# Patient Record
Sex: Male | Born: 1960 | Race: Black or African American | Hispanic: No | Marital: Single | Smoking: Former smoker
Health system: Southern US, Community
[De-identification: ages and names within clinical notes are randomized; demographics above are authoritative.]

## PROBLEM LIST (undated history)

## (undated) DIAGNOSIS — K635 Polyp of colon: Secondary | ICD-10-CM

## (undated) DIAGNOSIS — I10 Essential (primary) hypertension: Secondary | ICD-10-CM

## (undated) DIAGNOSIS — E7800 Pure hypercholesterolemia, unspecified: Secondary | ICD-10-CM

## (undated) HISTORY — PX: FRACTURE SURGERY: SHX138

---

## 2016-08-09 ENCOUNTER — Encounter (HOSPITAL_BASED_OUTPATIENT_CLINIC_OR_DEPARTMENT_OTHER): Payer: Self-pay | Admitting: Emergency Medicine

## 2016-08-09 ENCOUNTER — Emergency Department (HOSPITAL_BASED_OUTPATIENT_CLINIC_OR_DEPARTMENT_OTHER)
Admission: EM | Admit: 2016-08-09 | Discharge: 2016-08-09 | Disposition: A | Discharge status: Home / Self Care | Payer: Managed Care, Other (non HMO) | Attending: Emergency Medicine | Admitting: Emergency Medicine

## 2016-08-09 DIAGNOSIS — F17210 Nicotine dependence, cigarettes, uncomplicated: Secondary | ICD-10-CM | POA: Insufficient documentation

## 2016-08-09 DIAGNOSIS — Z79899 Other long term (current) drug therapy: Secondary | ICD-10-CM | POA: Diagnosis not present

## 2016-08-09 DIAGNOSIS — Z760 Encounter for issue of repeat prescription: Secondary | ICD-10-CM

## 2016-08-09 DIAGNOSIS — I10 Essential (primary) hypertension: Secondary | ICD-10-CM | POA: Diagnosis not present

## 2016-08-09 HISTORY — DX: Essential (primary) hypertension: I10

## 2016-08-09 HISTORY — DX: Polyp of colon: K63.5

## 2016-08-09 HISTORY — DX: Pure hypercholesterolemia, unspecified: E78.00

## 2016-08-09 MED ORDER — LISINOPRIL-HYDROCHLOROTHIAZIDE 20-12.5 MG PO TABS: 1.0000 | ORAL_TABLET | Freq: Every day | ORAL | 0 refills | Status: AC

## 2016-08-09 NOTE — ED Triage Notes (Addendum)
Pt reports needing refill of bp medication (lisinopril). Pt has not had meds in 2 days.  Pt just got new insurance and has to see MD before receiving bp medication.  Pt bp at this time 184/95.  Pt has headache at this time, denies unilateral weakness.  Denies cp/sob.

## 2016-08-09 NOTE — ED Provider Notes (Signed)
[facility] Note   CSN: [id number] Arrival date & time: 08/09/16  1318     History   Chief Complaint Chief Complaint  Patient presents with  . Medication Refill    HPI Darin Diaz is a 55 y.o. male.  HPI Patient presents for a medication refill. States his been out of his blood pressure medicine for 2 days.states it is due to financial issues. He has had a dull headache at times but otherwise no complaints. No congestion. No trouble breathing. States he now has insurance and will be able follow up.He is on lisinopril hydrochlorothiazide 20/12-1/2.No swelling. Past Medical History:  Diagnosis Date  . Colon polyp   . High cholesterol   . Hypertension     There are no active problems to display for this patient.   Past Surgical History:  Procedure Laterality Date  . FRACTURE SURGERY     plate placed in left arm       Home Medications    Prior to Admission medications   Medication Sig Start Date End Date Taking? Authorizing Provider  lisinopril-hydrochlorothiazide (PRINZIDE,ZESTORETIC) 20-12.5 MG tablet Take 1 tablet by mouth daily. 08/09/16   Benjiman Core, MD    Family History History reviewed. No pertinent family history.  Social History Social History  Substance Use Topics  . Smoking status: Current Every Day Smoker    Packs/day: 1.00    Types: Cigarettes  . Smokeless tobacco: Not on file     Comment: 1 pack every 2 days   . Alcohol use Yes     Comment: occ     Allergies   Tomato   Review of Systems Review of Systems  Constitutional: Negative for appetite change.  Respiratory: Negative for shortness of breath.   Cardiovascular: Negative for chest pain.  Gastrointestinal: Negative for abdominal pain.  Genitourinary: Negative for difficulty urinating.  Musculoskeletal: Negative for back pain.  Skin: Negative for wound.  Neurological: Positive for headaches.     Physical Exam Updated Vital Signs BP 184/95 (BP  Location: Left Arm)   Pulse 102   Temp 98.1 F (36.7 C) (Oral)   Resp 24   Ht 6\' 3"  (1.905 m)   Wt 252 lb (114.3 kg)   SpO2 99%   BMI 31.50 kg/m   Physical Exam  Constitutional: He appears well-developed.  HENT:  Head: Atraumatic.  Eyes: Pupils are equal, round, and reactive to light.  Neck: Neck supple.  Cardiovascular: Normal rate.   Pulmonary/Chest: Effort normal.  Abdominal: Soft.  Musculoskeletal: He exhibits no edema.  Neurological: He is alert.  Skin: Skin is warm.     ED Treatments / Results  Labs (all labs ordered are listed, but only abnormal results are displayed) Labs Reviewed - No data to display  EKG  EKG Interpretation None       Radiology No results found.  Procedures Procedures (including critical care time)  Medications Ordered in ED Medications - No data to display   Initial Impression / Assessment and Plan / ED Course  I have reviewed the triage vital signs and the nursing notes.  Pertinent labs & imaging results that were available during my care of the patient were reviewed by me and considered in my medical decision making (see chart for details).  Clinical Course    Patient requesting medicine refill.doubt end organ damage this time. States he has follow-up. Will refill for 1 month supply.  Final Clinical Impressions(s) / ED Diagnoses   Final diagnoses:  Essential  hypertension  Medication refill    New Prescriptions Discharge Medication List as of 08/09/2016  3:05 PM    START taking these medications   Details  lisinopril-hydrochlorothiazide (PRINZIDE,ZESTORETIC) 20-12.5 MG tablet Take 1 tablet by mouth daily., Starting Sat 08/09/2016, Print         Benjiman CoreNathan Chayil Gantt, MD 08/09/16 [phone]

## 2018-03-09 ENCOUNTER — Ambulatory Visit: Payer: Self-pay | Admitting: Family Medicine

## 2020-02-10 ENCOUNTER — Emergency Department
Admission: EM | Admit: 2020-02-10 | Discharge: 2020-02-10 | Disposition: A | Discharge status: Home / Self Care | Payer: Worker's Compensation | Attending: Student | Admitting: Student

## 2020-02-10 ENCOUNTER — Other Ambulatory Visit: Payer: Self-pay

## 2020-02-10 ENCOUNTER — Emergency Department: Payer: Worker's Compensation

## 2020-02-10 DIAGNOSIS — Z23 Encounter for immunization: Secondary | ICD-10-CM | POA: Insufficient documentation

## 2020-02-10 DIAGNOSIS — Y929 Unspecified place or not applicable: Secondary | ICD-10-CM | POA: Diagnosis not present

## 2020-02-10 DIAGNOSIS — Y9389 Activity, other specified: Secondary | ICD-10-CM | POA: Diagnosis not present

## 2020-02-10 DIAGNOSIS — Y990 Civilian activity done for income or pay: Secondary | ICD-10-CM | POA: Diagnosis not present

## 2020-02-10 DIAGNOSIS — S6721XA Crushing injury of right hand, initial encounter: Secondary | ICD-10-CM | POA: Insufficient documentation

## 2020-02-10 DIAGNOSIS — W230XXA Caught, crushed, jammed, or pinched between moving objects, initial encounter: Secondary | ICD-10-CM | POA: Diagnosis not present

## 2020-02-10 DIAGNOSIS — S61011A Laceration without foreign body of right thumb without damage to nail, initial encounter: Secondary | ICD-10-CM | POA: Insufficient documentation

## 2020-02-10 DIAGNOSIS — F17210 Nicotine dependence, cigarettes, uncomplicated: Secondary | ICD-10-CM | POA: Diagnosis not present

## 2020-02-10 MED ORDER — CEPHALEXIN 500 MG PO CAPS: 500.0000 mg | ORAL_CAPSULE | Freq: Three times a day (TID) | ORAL | 0 refills | Status: DC

## 2020-02-10 MED ORDER — CEPHALEXIN 500 MG PO CAPS
500.0000 mg | ORAL_CAPSULE | Freq: Three times a day (TID) | ORAL | 0 refills | Status: AC
Start: 2020-02-10 — End: ?

## 2020-02-10 MED ORDER — BACITRACIN-NEOMYCIN-POLYMYXIN 400-5-5000 EX OINT
TOPICAL_OINTMENT | Freq: Once | CUTANEOUS | Status: AC
  Administered 2020-02-10: 1 via TOPICAL
  Filled 2020-02-10: qty 1

## 2020-02-10 MED ORDER — LIDOCAINE HCL (PF) 1 % IJ SOLN
5.0000 mL | Freq: Once | INTRAMUSCULAR | Status: AC
  Administered 2020-02-10: 5 mL via INTRADERMAL
  Filled 2020-02-10: qty 5

## 2020-02-10 MED ORDER — TETANUS-DIPHTH-ACELL PERTUSSIS 5-2.5-18.5 LF-MCG/0.5 IM SUSP
0.5000 mL | Freq: Once | INTRAMUSCULAR | Status: AC
  Administered 2020-02-10: 0.5 mL via INTRAMUSCULAR
  Filled 2020-02-10: qty 0.5

## 2020-02-10 MED ORDER — HYDROCODONE-ACETAMINOPHEN 5-325 MG PO TABS: 1.0000 | ORAL_TABLET | Freq: Four times a day (QID) | ORAL | 0 refills | Status: DC | PRN

## 2020-02-10 MED ORDER — HYDROCODONE-ACETAMINOPHEN 5-325 MG PO TABS
1.0000 | ORAL_TABLET | Freq: Four times a day (QID) | ORAL | 0 refills | Status: DC | PRN
Start: 2020-02-10 — End: 2020-02-10

## 2020-02-10 NOTE — ED Notes (Signed)
Reference triage note. Pt with full sensation and 2+ pulses to affected hand. Bleeding  Controlled at this time.

## 2020-02-10 NOTE — ED Provider Notes (Signed)
[facility] Emergency Department Provider Note  ____________________________________________   First MD Initiated Contact with Patient 02/10/20 2148     (approximate)  I have reviewed the triage vital signs and the nursing notes.   HISTORY  Chief Complaint Hand Injury    HPI Darin Diaz is a 59 y.o. male presents emergency department after hurting his hand at work.  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  Unsure of his last tetanus.    Past Medical History:  Diagnosis Date  . Colon polyp   . High cholesterol   . Hypertension     There are no problems to display for this patient.   Past Surgical History:  Procedure Laterality Date  . FRACTURE SURGERY     plate placed in left arm    Prior to Admission medications   Medication Sig Start Date End Date Taking? Authorizing Provider  cephALEXin (KEFLEX) 500 MG capsule Take 1 capsule (500 mg total) by mouth 3 (three) times daily. 02/10/20   Chyann Ambrocio, Roselyn Bering, PA-C  HYDROcodone-acetaminophen (NORCO/VICODIN) 5-325 MG tablet Take 1 tablet by mouth every 6 (six) hours as needed for moderate pain. 02/10/20   Mihira Tozzi, Roselyn Bering, PA-C  lisinopril-hydrochlorothiazide (PRINZIDE,ZESTORETIC) 20-12.5 MG tablet Take 1 tablet by mouth daily. 08/09/16   Benjiman Core, MD    Allergies Tomato  No family history on file.  Social History Social History   Tobacco Use  . Smoking status: Current Every Day Smoker    Packs/day: 1.00    Types: Cigarettes  . Smokeless tobacco: Never Used  . Tobacco comment: 1 pack every 2 days   Substance Use Topics  . Alcohol use: Yes    Comment: occ  . Drug use: No    Review of Systems  Constitutional: No fever/chills Eyes: No visual changes. ENT: No sore throat. Respiratory: Denies cough Genitourinary: Negative for  dysuria. Musculoskeletal: Negative for back pain.  Positive for right thumb pain and laceration Skin: Negative for rash. Psychiatric: no mood changes,     ____________________________________________   PHYSICAL EXAM:  VITAL SIGNS: ED Triage Vitals [02/10/20 2125]  Enc Vitals Group     BP (!) 175/91     Pulse Rate 60     Resp 17     Temp 98.3 F (36.8 C)     Temp src      SpO2 100 %     Weight 249 lb (112.9 kg)     Height 6\' 3"  (1.905 m)     Head Circumference      Peak Flow      Pain Score 6     Pain Loc      Pain Edu?      Excl. in GC?     Constitutional: Alert and oriented. Well appearing and in no acute distress. Eyes: Conjunctivae are normal.  Head: Atraumatic. Nose: No congestion/rhinnorhea. Mouth/Throat: Mucous membranes are moist.   Neck:  supple no lymphadenopathy noted Cardiovascular: Normal rate, regular rhythm. Respiratory: Normal respiratory effort.  No retractions GU: deferred Musculoskeletal: FROM all extremities, warm and well perfused, right thumb is tender to palpation, 3 cm laceration noted to the volar surface, full range of motion, neurovascular intact Neurologic:  Normal speech and language.  Skin:  Skin is warm, dry. No rash noted. Psychiatric: Mood and affect are  normal. Speech and behavior are normal.  ____________________________________________   LABS (all labs ordered are listed, but only abnormal results are displayed)  Labs Reviewed - No data to display ____________________________________________   ____________________________________________  RADIOLOGY  X-ray of the right hand is negative for fracture  ____________________________________________   PROCEDURES  Procedure(s) performed:   Marland KitchenMarland KitchenLaceration Repair  Date/Time: 02/10/2020 11:11 PM Performed by: Faythe Ghee, PA-C Authorized by: Faythe Ghee, PA-C   Consent:    Consent obtained:  Verbal   Consent given by:  Patient   Risks discussed:  Infection,  pain, retained foreign body, need for additional repair, poor cosmetic result, tendon damage, nerve damage, poor wound healing and vascular damage Anesthesia (see MAR for exact dosages):    Anesthesia method:  Nerve block   Block needle gauge:  27 G   Block anesthetic:  Lidocaine 1% w/o epi   Block injection procedure:  Anatomic landmarks identified, introduced needle, incremental injection, anatomic landmarks palpated and negative aspiration for blood   Block outcome:  Anesthesia achieved Laceration details:    Location:  Finger   Finger location:  L thumb   Length (cm):  3 Repair type:    Repair type:  Simple Pre-procedure details:    Preparation:  Patient was prepped and draped in usual sterile fashion and imaging obtained to evaluate for foreign bodies Exploration:    Hemostasis achieved with:  Direct pressure   Wound exploration: wound explored through full range of motion     Wound extent: no foreign bodies/material noted, no muscle damage noted, no nerve damage noted, no tendon damage noted, no underlying fracture noted and no vascular damage noted   Treatment:    Area cleansed with:  Betadine and saline   Amount of cleaning:  Standard   Irrigation solution:  Sterile saline   Irrigation method:  Syringe and tap   Visualized foreign bodies/material removed: no   Skin repair:    Repair method:  Sutures   Suture size:  5-0   Suture material:  Nylon   Suture technique:  Simple interrupted   Number of sutures:  6 Approximation:    Approximation:  Close Post-procedure details:    Dressing:  Antibiotic ointment, non-adherent dressing and splint for protection   Patient tolerance of procedure:  Tolerated well, no immediate complications      ____________________________________________   INITIAL IMPRESSION / ASSESSMENT AND PLAN / ED COURSE  Pertinent labs & imaging results that were available during my care of the patient were reviewed by me and considered in my medical  decision making (see chart for details).   Patient is a 59 year old male presents emergency department with concerns of a laceration and crush injury to the right hand while at work.  See HPI  Physical exam shows patient to appear well.  Right thumb is tender and has a 3 cm laceration.  No foreign body.  No tendon involvement noted.  Neurovascular is intact.  See procedure note for repair  Tdap was updated while he is in the ED.  Patient had his Covid vaccine 3 weeks ago so we are out of the timeframe of where it would interfere.  I did instruct him to call the center where he is, have his second vaccine on Monday to see if it will interfere with the second vaccine.  If so he could wait 1 to 2 weeks prior to getting the second vaccine.  He was placed on a prescription for antibiotics as there is dirt  and brake dust noted on the hand.  He is to follow-up with an urgent care in his area or orthopedics of [employer] choice if any problems with the thumb.  Suture should be removed in 7 to 10 days.  He was given a prescription for Keflex and Vicodin.  He is to follow-up as needed.  Work restrictions were noted in the work note.  No use of the right hand for 5 days.  Suture should be removed in 7-10.    Darin Diaz was evaluated in Emergency Department on 02/10/2020 for the symptoms described in the history of present illness. He was evaluated in the context of the global COVID-19 pandemic, which necessitated consideration that the patient might be at risk for infection with the SARS-CoV-2 virus that causes COVID-19. Institutional protocols and algorithms that pertain to the evaluation of patients at risk for COVID-19 are in a state of rapid change based on information released by regulatory bodies including the CDC and federal and state organizations. These policies and algorithms were followed during the patient's care in the ED.   As part of my medical decision making, I reviewed the following data  within the [place] notes reviewed and incorporated, Old chart reviewed, Radiograph reviewed , Notes from prior ED visits and [place] Controlled Substance Database  ____________________________________________   FINAL CLINICAL IMPRESSION(S) / ED DIAGNOSES  Final diagnoses:  Laceration of right thumb without foreign body without damage to nail, initial encounter  Crush injury to hand, right, initial encounter      NEW MEDICATIONS STARTED DURING THIS VISIT:  Current Discharge Medication List    START taking these medications   Details  cephALEXin (KEFLEX) 500 MG capsule Take 1 capsule (500 mg total) by mouth 3 (three) times daily. Qty: 21 capsule, Refills: 0    HYDROcodone-acetaminophen (NORCO/VICODIN) 5-325 MG tablet Take 1 tablet by mouth every 6 (six) hours as needed for moderate pain. Qty: 15 tablet, Refills: 0         Note:  This document was prepared using Dragon voice recognition software and may include unintentional dictation errors.    Versie Starks, PA-C 02/10/20 2313    Lilia Pro., MD 02/11/20 [phone]

## 2020-02-10 NOTE — Discharge Instructions (Signed)
Follow-up with orthopedics if any problems with movement or pain with the thumb.  Suture should be removed in 7 to 10 days.  You may either return here, go to an urgent care, or have someone you know remove the sutures.  Take the antibiotic as prescribed.  Tylenol and ibuprofen for pain as needed.

## 2020-02-10 NOTE — ED Triage Notes (Signed)
Patient reports his right hand was stuck between vehicle and piece of equipment at work. Patient with 1 1/2" circumferential laceration around distal thumb. Patient c/o pain to finger/hand.

## 2020-08-05 ENCOUNTER — Emergency Department (HOSPITAL_COMMUNITY): Payer: 59

## 2020-08-05 ENCOUNTER — Other Ambulatory Visit: Payer: Self-pay

## 2020-08-05 ENCOUNTER — Emergency Department (HOSPITAL_BASED_OUTPATIENT_CLINIC_OR_DEPARTMENT_OTHER)
Admit: 2020-08-05 | Discharge: 2020-08-05 | Disposition: A | Discharge status: Home / Self Care | Payer: 59 | Attending: Emergency Medicine | Admitting: Emergency Medicine

## 2020-08-05 ENCOUNTER — Encounter (HOSPITAL_COMMUNITY): Payer: Self-pay | Admitting: Emergency Medicine

## 2020-08-05 ENCOUNTER — Emergency Department (HOSPITAL_COMMUNITY)
Admission: EM | Admit: 2020-08-05 | Discharge: 2020-08-05 | Disposition: A | Discharge status: Home / Self Care | Payer: 59 | Attending: Emergency Medicine | Admitting: Emergency Medicine

## 2020-08-05 DIAGNOSIS — Z79899 Other long term (current) drug therapy: Secondary | ICD-10-CM | POA: Diagnosis not present

## 2020-08-05 DIAGNOSIS — R0789 Other chest pain: Secondary | ICD-10-CM | POA: Insufficient documentation

## 2020-08-05 DIAGNOSIS — F17210 Nicotine dependence, cigarettes, uncomplicated: Secondary | ICD-10-CM | POA: Diagnosis not present

## 2020-08-05 DIAGNOSIS — I10 Essential (primary) hypertension: Secondary | ICD-10-CM | POA: Diagnosis not present

## 2020-08-05 DIAGNOSIS — M7989 Other specified soft tissue disorders: Secondary | ICD-10-CM | POA: Diagnosis not present

## 2020-08-05 DIAGNOSIS — R079 Chest pain, unspecified: Secondary | ICD-10-CM

## 2020-08-05 LAB — CBC
HCT: 43.7 % (ref 39.0–52.0)
Hemoglobin: 15.1 g/dL (ref 13.0–17.0)
MCH: 30.4 pg (ref 26.0–34.0)
MCHC: 34.6 g/dL (ref 30.0–36.0)
MCV: 88.1 fL (ref 80.0–100.0)
Platelets: 185 10*3/uL (ref 150–400)
RBC: 4.96 MIL/uL (ref 4.22–5.81)
RDW: 12.3 % (ref 11.5–15.5)
WBC: 5.7 10*3/uL (ref 4.0–10.5)
nRBC: 0 % (ref 0.0–0.2)

## 2020-08-05 LAB — BASIC METABOLIC PANEL
Anion gap: 11 (ref 5–15)
BUN: 18 mg/dL (ref 6–20)
CO2: 24 mmol/L (ref 22–32)
Calcium: 9.8 mg/dL (ref 8.9–10.3)
Chloride: 104 mmol/L (ref 98–111)
Creatinine, Ser: 1.17 mg/dL (ref 0.61–1.24)
GFR, Estimated: >60 mL/min (ref >60)
Glucose, Bld: 125 mg/dL — ABNORMAL HIGH (ref 70–99)
Potassium: 3.7 mmol/L (ref 3.5–5.1)
Sodium: 139 mmol/L (ref 135–145)

## 2020-08-05 LAB — TROPONIN I (HIGH SENSITIVITY)
Troponin I (High Sensitivity): 6 ng/L (ref <18)
Troponin I (High Sensitivity): 5 ng/L (ref <18)

## 2020-08-05 MED ORDER — ONDANSETRON 4 MG PO TBDP
4.0000 mg | ORAL_TABLET | Freq: Once | ORAL | Status: AC
  Administered 2020-08-05: 4 mg via ORAL
  Filled 2020-08-05: qty 1

## 2020-08-05 MED ORDER — ACETAMINOPHEN 325 MG PO TABS
650.0000 mg | ORAL_TABLET | Freq: Once | ORAL | Status: DC
  Filled 2020-08-05: qty 2

## 2020-08-05 MED ORDER — ONDANSETRON 4 MG PO TBDP: 4.0000 mg | ORAL_TABLET | Freq: Three times a day (TID) | ORAL | 0 refills | Status: DC | PRN

## 2020-08-05 MED ORDER — HYDROXYZINE HCL 25 MG PO TABS: 25.0000 mg | ORAL_TABLET | Freq: Four times a day (QID) | ORAL | 0 refills | Status: AC

## 2020-08-05 MED ORDER — FAMOTIDINE 20 MG PO TABS: 20.0000 mg | ORAL_TABLET | Freq: Two times a day (BID) | ORAL | 0 refills | Status: AC

## 2020-08-05 NOTE — Progress Notes (Signed)
Right lower extremity venous duplex has been completed. Preliminary results can be found in CV Proc through chart review.  Results were given to [facility] PA.  08/05/20 11:28 AM Olen Cordial RVT

## 2020-08-05 NOTE — Discharge Instructions (Addendum)
After reviewing your lab work from today as well as your EKGs and cardiac catheterization done last month at [facility] I am reassured that you have no evidence today of any heart or lung damage.  Your chest x-ray was also reassuring.  Please follow-up with your pulmonologist and your primary care doctor.  Please drink plenty of water continue to do deep breathing exercises to help with anxiety and take hydroxyzine as prescribed as needed.  You may return to the ER for any new or concerning symptoms at any time. You may wish to follow up with a cardiologist in your area / with [facility] where most of your care has been however you may also follow up with our cardiologists here.

## 2020-08-05 NOTE — ED Notes (Signed)
Patient verbalizes understanding of discharge instructions. Opportunity for questioning and answers were provided. Armband removed by staff, pt discharged from ED to home via POV  
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
## 2020-08-05 NOTE — ED Triage Notes (Signed)
Pt reports L sided chest tightness and SOB with nausea since last night.  States he was admitted to [facility] 1 1/2 weeks ago with chest pain.

## 2022-05-06 IMAGING — CR DG CHEST 2V
2 series · 2 of 2 positions shown · non-contrast
Comparison: 07/17/2020

CLINICAL DATA: 5-year-old male with left-sided chest tightness and
shortness of breath nausea.

EXAM:
CHEST - 2 VIEW

[chest pa]
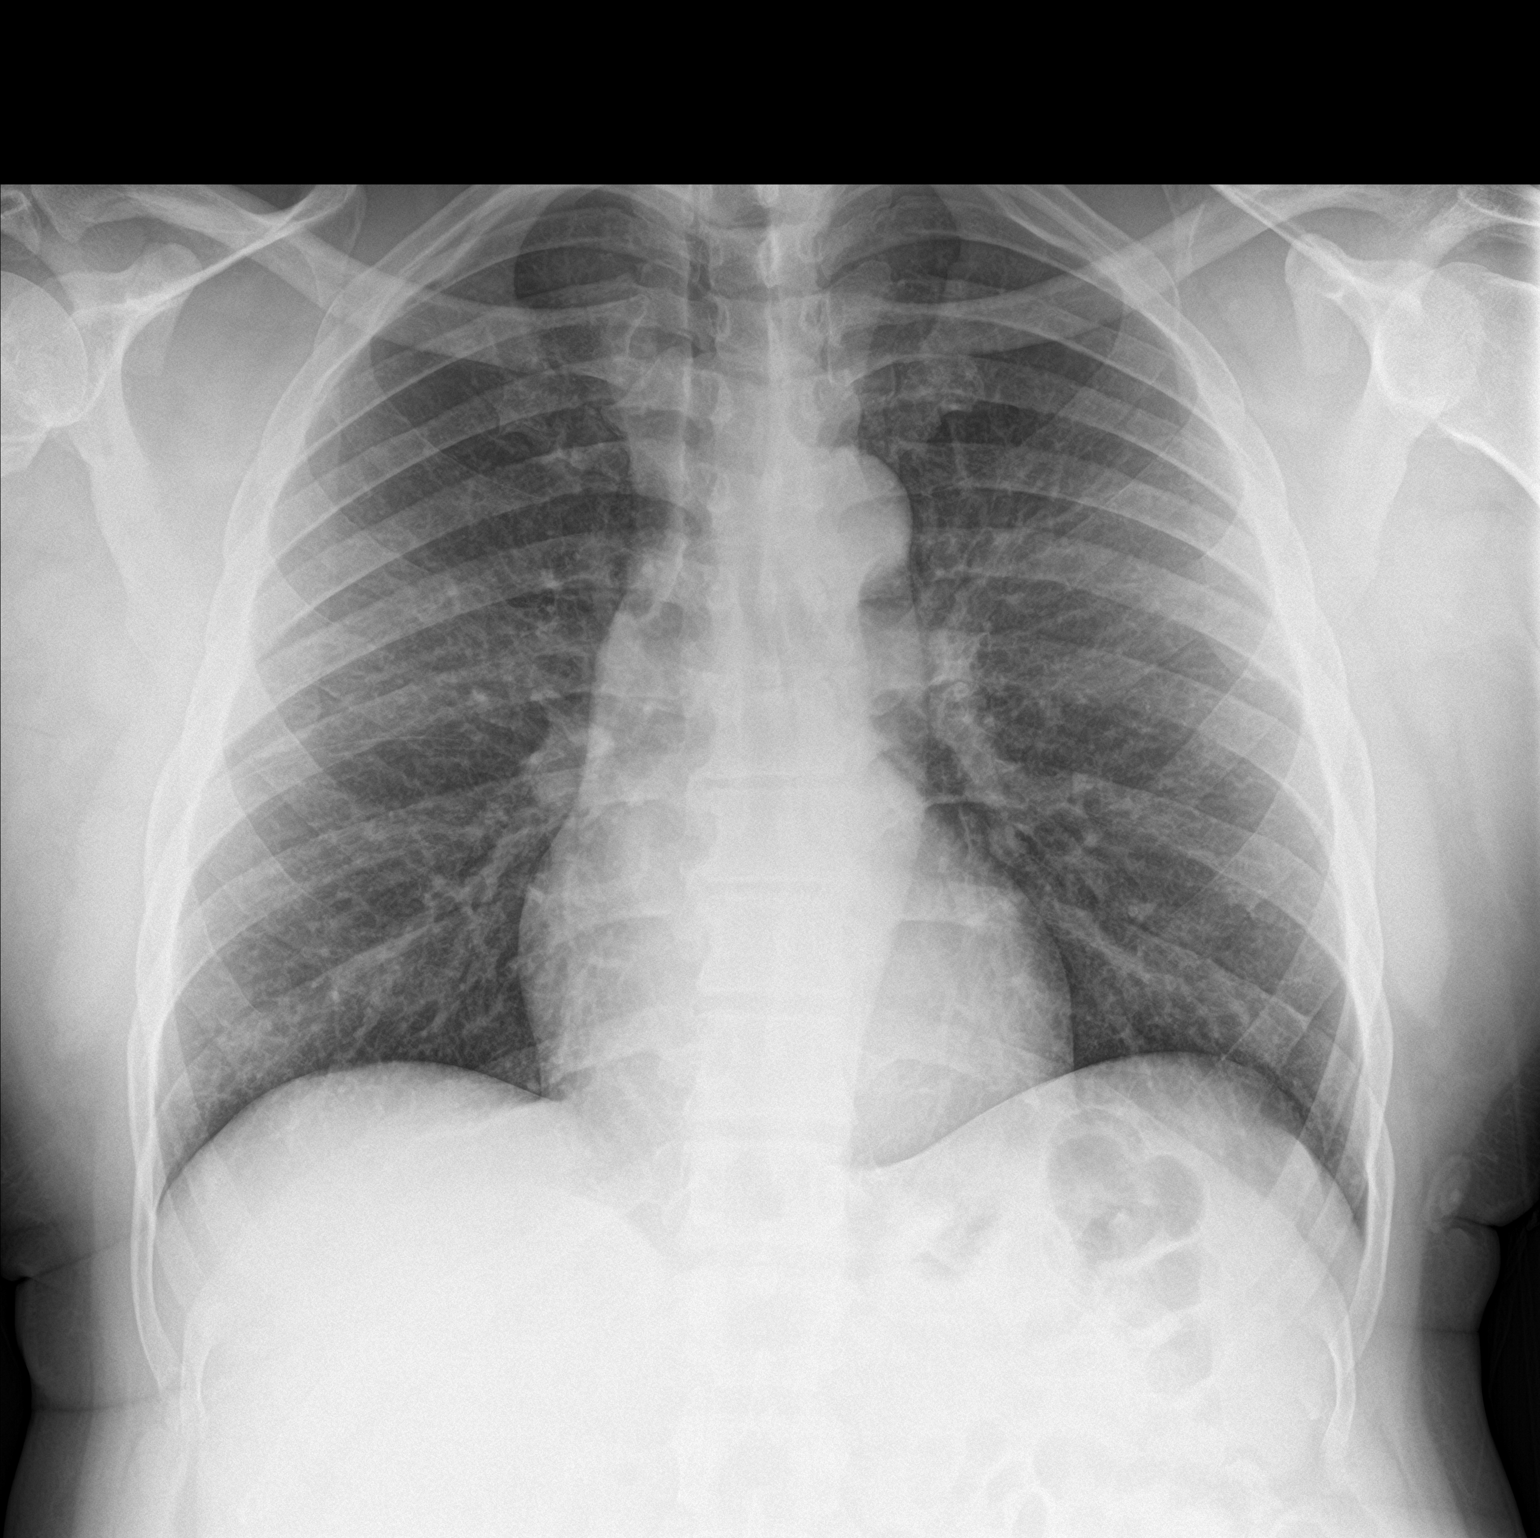

[chest lat]
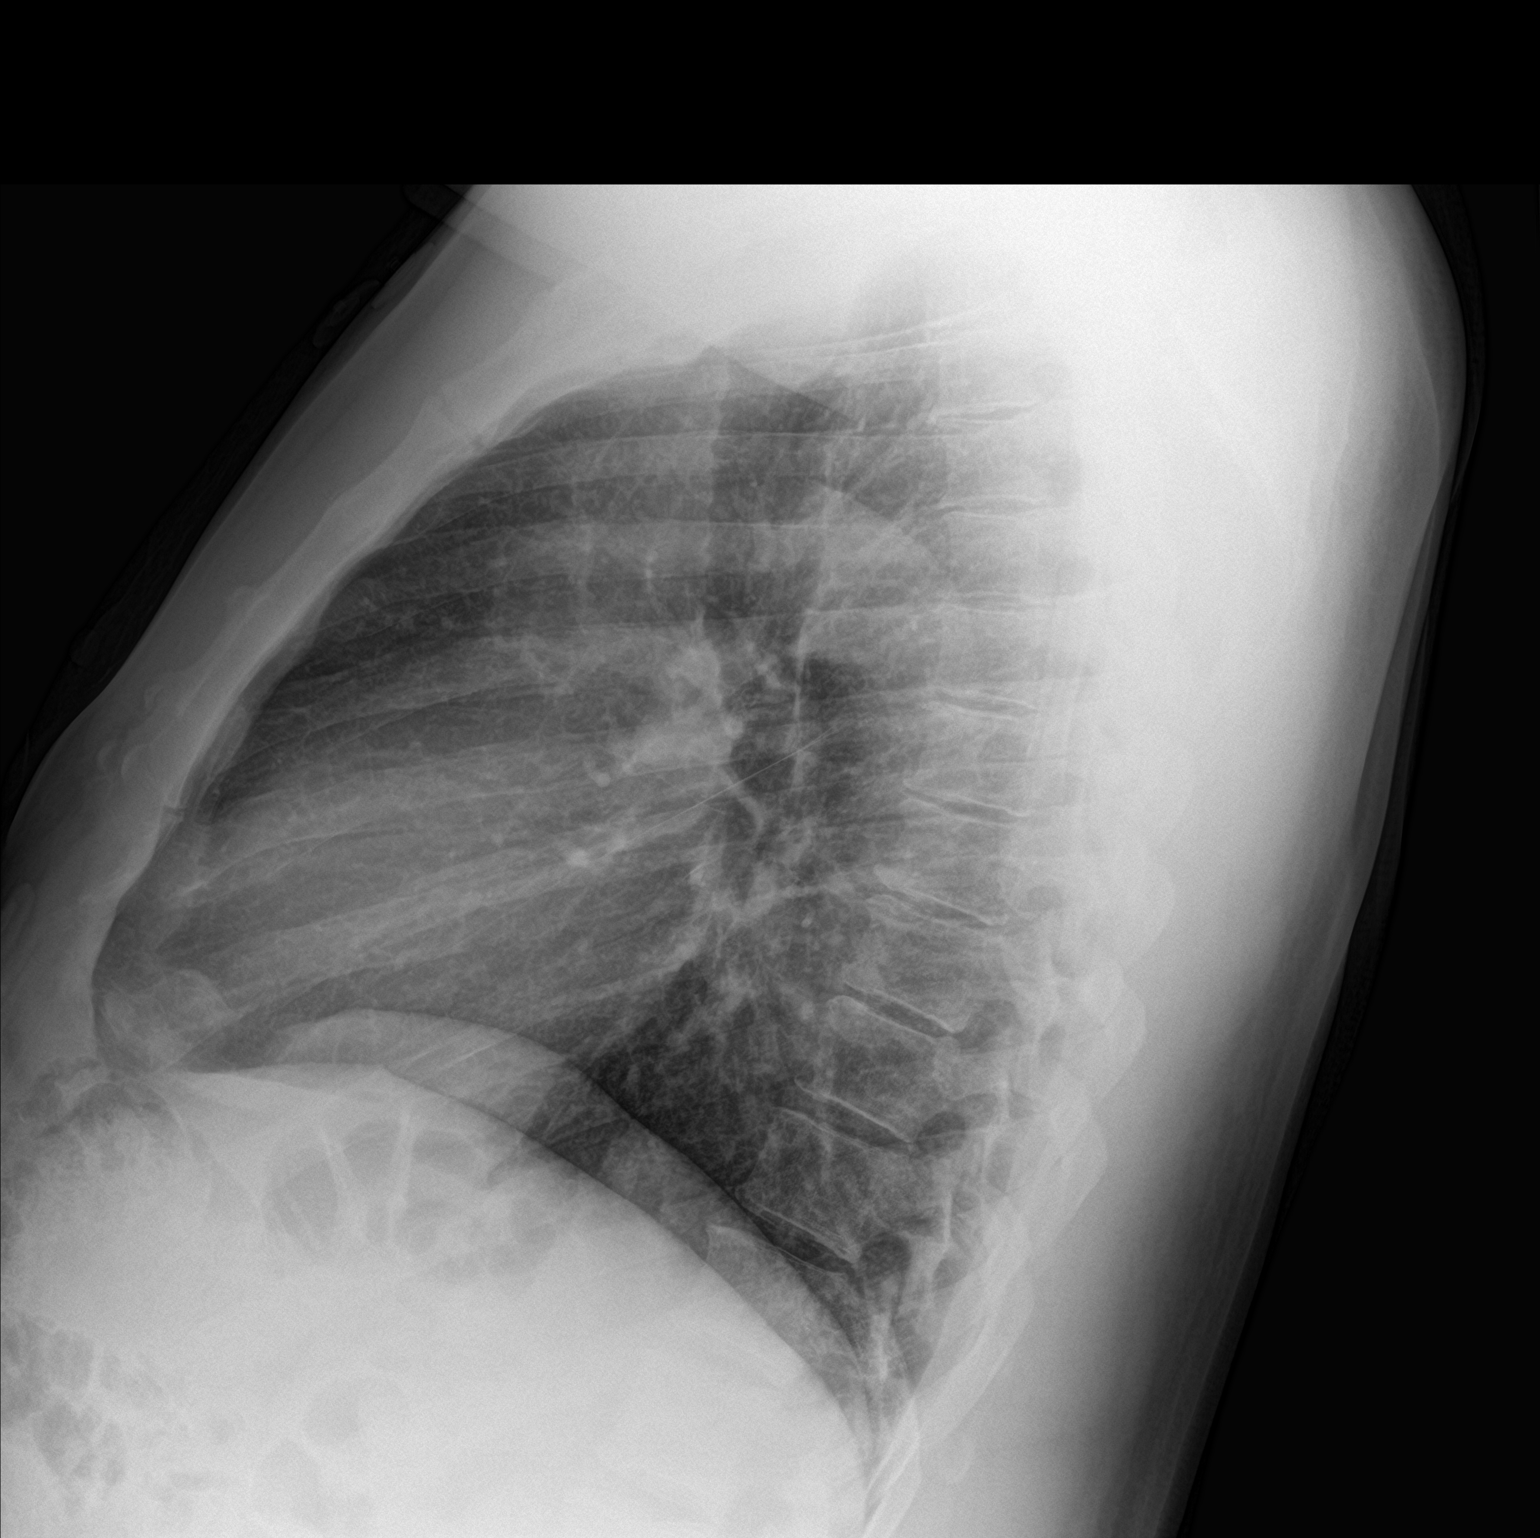

[2 of 2 positions shown; findings below may reference images not displayed]

FINDINGS: The heart size and mediastinal contours are within normal limits.
Both lungs are clear. The visualized skeletal structures are
unremarkable.
IMPRESSION: No active cardiopulmonary disease.

## 2022-06-02 ENCOUNTER — Emergency Department (HOSPITAL_BASED_OUTPATIENT_CLINIC_OR_DEPARTMENT_OTHER): Payer: 59

## 2022-06-02 ENCOUNTER — Other Ambulatory Visit: Payer: Self-pay

## 2022-06-02 ENCOUNTER — Emergency Department (HOSPITAL_BASED_OUTPATIENT_CLINIC_OR_DEPARTMENT_OTHER)
Admission: EM | Admit: 2022-06-02 | Discharge: 2022-06-02 | Disposition: A | Discharge status: Home / Self Care | Payer: 59 | Attending: Emergency Medicine | Admitting: Emergency Medicine

## 2022-06-02 ENCOUNTER — Encounter (HOSPITAL_BASED_OUTPATIENT_CLINIC_OR_DEPARTMENT_OTHER): Payer: Self-pay | Admitting: Pediatrics

## 2022-06-02 DIAGNOSIS — M545 Low back pain, unspecified: Secondary | ICD-10-CM | POA: Diagnosis not present

## 2022-06-02 DIAGNOSIS — M542 Cervicalgia: Secondary | ICD-10-CM | POA: Diagnosis not present

## 2022-06-02 DIAGNOSIS — M546 Pain in thoracic spine: Secondary | ICD-10-CM | POA: Diagnosis not present

## 2022-06-02 DIAGNOSIS — H538 Other visual disturbances: Secondary | ICD-10-CM | POA: Diagnosis not present

## 2022-06-02 DIAGNOSIS — N400 Enlarged prostate without lower urinary tract symptoms: Secondary | ICD-10-CM | POA: Insufficient documentation

## 2022-06-02 DIAGNOSIS — Y92410 Unspecified street and highway as the place of occurrence of the external cause: Secondary | ICD-10-CM | POA: Diagnosis not present

## 2022-06-02 DIAGNOSIS — R519 Headache, unspecified: Secondary | ICD-10-CM | POA: Diagnosis present

## 2022-06-02 LAB — CBC
HCT: 41.2 % (ref 39.0–52.0)
Hemoglobin: 14.3 g/dL (ref 13.0–17.0)
MCH: 29.6 pg (ref 26.0–34.0)
MCHC: 34.7 g/dL (ref 30.0–36.0)
MCV: 85.3 fL (ref 80.0–100.0)
Platelets: 194 10*3/uL (ref 150–400)
RBC: 4.83 MIL/uL (ref 4.22–5.81)
RDW: 12.6 % (ref 11.5–15.5)
WBC: 5.6 10*3/uL (ref 4.0–10.5)
nRBC: 0 % (ref 0.0–0.2)

## 2022-06-02 LAB — LIPASE, BLOOD: Lipase: 29 U/L (ref 11–51)

## 2022-06-02 LAB — COMPREHENSIVE METABOLIC PANEL
ALT: 27 U/L (ref 0–44)
AST: 25 U/L (ref 15–41)
Albumin: 4.3 g/dL (ref 3.5–5.0)
Alkaline Phosphatase: 53 U/L (ref 38–126)
Anion gap: 11 (ref 5–15)
BUN: 17 mg/dL (ref 8–23)
CO2: 22 mmol/L (ref 22–32)
Calcium: 8.8 mg/dL — ABNORMAL LOW (ref 8.9–10.3)
Chloride: 105 mmol/L (ref 98–111)
Creatinine, Ser: 1.3 mg/dL — ABNORMAL HIGH (ref 0.61–1.24)
GFR, Estimated: >60 mL/min (ref >60)
Glucose, Bld: 202 mg/dL — ABNORMAL HIGH (ref 70–99)
Potassium: 3.3 mmol/L — ABNORMAL LOW (ref 3.5–5.1)
Sodium: 138 mmol/L (ref 135–145)
Total Bilirubin: 0.6 mg/dL (ref 0.3–1.2)
Total Protein: 7.3 g/dL (ref 6.5–8.1)

## 2022-06-02 LAB — URINALYSIS, ROUTINE W REFLEX MICROSCOPIC
Bilirubin Urine: NEGATIVE
Glucose, UA: NEGATIVE mg/dL
Hgb urine dipstick: NEGATIVE
Ketones, ur: NEGATIVE mg/dL
Leukocytes,Ua: NEGATIVE
Nitrite: NEGATIVE
Protein, ur: NEGATIVE mg/dL
Specific Gravity, Urine: >=1.03 (ref 1.005–1.030)
pH: 5.5 (ref 5.0–8.0)

## 2022-06-02 MED ORDER — HYDROMORPHONE HCL 1 MG/ML IJ SOLN
1.0000 mg | Freq: Once | INTRAMUSCULAR | Status: AC
  Administered 2022-06-02: 1 mg via INTRAVENOUS
  Filled 2022-06-02: qty 1

## 2022-06-02 MED ORDER — OXYCODONE-ACETAMINOPHEN 5-325 MG PO TABS: 1.0000 | ORAL_TABLET | Freq: Four times a day (QID) | ORAL | 0 refills | Status: AC | PRN

## 2022-06-02 MED ORDER — ONDANSETRON 8 MG PO TBDP
8.0000 mg | ORAL_TABLET | Freq: Three times a day (TID) | ORAL | 0 refills | Status: AC | PRN
Start: 2022-06-02 — End: ?

## 2022-06-02 MED ORDER — IOHEXOL 300 MG/ML  SOLN
100.0000 mL | Freq: Once | INTRAMUSCULAR | Status: AC | PRN
  Administered 2022-06-02: 100 mL via INTRAVENOUS
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
## 2022-06-02 NOTE — Discharge Instructions (Addendum)
Your work-up today was reassuring, no traumatic findings on the imaging of your head, chest, abdomen, back.  You do have an enlarged prostate which should be followed up on by urologist, information provided above please call and schedule an appointment.  See your primary care in a week if you are still having pain and unable to work.  Work note provided for a week.  Take Tylenol and Motrin for pain.  I also sent a short course of narcotic medicine, take with Zofran to prevent any nausea and vomiting.
